# Patient Record
Sex: Female | Born: 1990 | Race: White | Hispanic: No | Marital: Single | Smoking: Never smoker
Health system: Southern US, Community
[De-identification: ages and names within clinical notes are randomized; demographics above are authoritative.]

## PROBLEM LIST (undated history)

## (undated) DIAGNOSIS — F419 Anxiety disorder, unspecified: Secondary | ICD-10-CM

---
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
## 2014-06-17 ENCOUNTER — Emergency Department: Payer: Self-pay | Admitting: Emergency Medicine

## 2021-03-16 ENCOUNTER — Other Ambulatory Visit: Payer: Self-pay

## 2021-03-16 ENCOUNTER — Ambulatory Visit
Admission: EM | Admit: 2021-03-16 | Discharge: 2021-03-16 | Disposition: A | Discharge status: Home / Self Care | Payer: Medicaid Other | Attending: Sports Medicine | Admitting: Sports Medicine

## 2021-03-16 ENCOUNTER — Ambulatory Visit: Admit: 2021-03-16 | Payer: Self-pay

## 2021-03-16 DIAGNOSIS — K121 Other forms of stomatitis: Secondary | ICD-10-CM | POA: Diagnosis not present

## 2021-03-16 DIAGNOSIS — J029 Acute pharyngitis, unspecified: Secondary | ICD-10-CM | POA: Diagnosis not present

## 2021-03-16 LAB — GROUP A STREP BY PCR: Group A Strep by PCR: NOT DETECTED

## 2021-03-16 MED ORDER — LIDOCAINE VISCOUS HCL 2 % MT SOLN: 15.0000 mL | Freq: Four times a day (QID) | OROMUCOSAL | 0 refills | Status: DC | PRN

## 2021-03-16 NOTE — ED Provider Notes (Signed)
[facility]    CSN: [id number] Arrival date & time: 03/16/21  1147      History   Chief Complaint No chief complaint on file.   HPI Nicole Curry is a 30 y.o. female.   30 year old female who presents for evaluation of an ulcerated blister on the posterior oropharynx on the right side.  She noticed it 2 days ago.  She does have an associated sore throat.  She says in the past she has had multiple strep infections.  No fever shakes chills.  No strep exposure.  No nausea vomiting or diarrhea.  No abdominal or urinary symptoms.  Primary care needs are met by [facility] in [place].  She stays at home and does not work outside of the home.  She reports that she sometimes gets she calls tonsil stones that she picks at it and she is concerned that maybe she infected that area.  Some mild pain with swallowing.  She has been doing salt water gargles but no throat lozenges.  She has no other URI symptoms including cough, nasal congestion, chest pain or shortness of breath.  No red flag signs or symptoms elicited on history.   History reviewed. No pertinent past medical history.  There are no problems to display for this patient.   History reviewed. No pertinent surgical history.  OB History   No obstetric history on file.      Home Medications    Prior to Admission medications   Medication Sig Start Date End Date Taking? Authorizing Provider  Levonorgestrel-Ethinyl Estradiol (AMETHIA) 0.15-0.03 &0.01 MG tablet Take 1 tablet by mouth daily. 05/10/20 05/10/21 Yes [provider]  lidocaine (XYLOCAINE) 2 % solution Use as directed 15 mLs in the mouth or throat every 6 (six) hours as needed for mouth pain. Swish and spit 03/16/21  Yes Verda Cumins, MD  DULoxetine (CYMBALTA) 30 MG capsule Take 30 mg by mouth every morning. 02/21/21   [provider]    Family History History reviewed. No pertinent family history.  Social History Social History   Tobacco Use    Smoking status: Never   Smokeless tobacco: Never     Allergies   Patient has no allergy information on record.   Review of Systems Review of Systems  Constitutional:  Negative for activity change, appetite change, chills, diaphoresis, fatigue and fever.  HENT:  Positive for sore throat and trouble swallowing. Negative for congestion, ear pain, postnasal drip, rhinorrhea, sinus pressure, sinus pain and sneezing.   Eyes:  Negative for pain.  Respiratory:  Negative for cough, chest tightness and shortness of breath.   Cardiovascular:  Negative for chest pain and palpitations.  Gastrointestinal:  Negative for abdominal pain, diarrhea, nausea and vomiting.  Genitourinary:  Negative for dysuria.  Musculoskeletal:  Negative for back pain, myalgias and neck pain.  Skin:  Negative for color change, pallor, rash and wound.  Neurological:  Negative for dizziness, speech difficulty, light-headedness and headaches.  All other systems reviewed and are negative.   Physical Exam Triage Vital Signs ED Triage Vitals  Enc Vitals Group     BP 03/16/21 1201 136/80     Pulse Rate 03/16/21 1201 85     Resp 03/16/21 1201 16     Temp 03/16/21 1201 98.4 F (36.9 C)     Temp Source 03/16/21 1201 Oral     SpO2 03/16/21 1201 100 %     Weight 03/16/21 1158 140 lb (63.5 kg)     Height  03/16/21 1158 _0  (1.6 m)     Head Circumference --      Peak Flow --      Pain Score 03/16/21 1158 3     Pain Loc --      Pain Edu? --      Excl. in [place]? --    No data found.  Updated Vital Signs BP 136/80 (BP Location: Left Arm)   Pulse 85   Temp 98.4 F (36.9 C) (Oral)   Resp 16   Ht _1  (1.6 m)   Wt 63.5 kg   LMP 01/14/2021 (Approximate)   SpO2 100%   BMI 24.80 kg/m   Visual Acuity Right Eye Distance:   Left Eye Distance:   Bilateral Distance:    Right Eye Near:   Left Eye Near:    Bilateral Near:     Physical Exam Vitals and nursing note reviewed.  Constitutional:      General: She is  not in acute distress.    Appearance: Normal appearance. She is not ill-appearing, toxic-appearing or diaphoretic.  HENT:     Head: Normocephalic and atraumatic.     Nose: Nose normal. No congestion or rhinorrhea.     Mouth/Throat:     Mouth: Mucous membranes are moist. Oral lesions present.     Pharynx: Uvula midline. Posterior oropharyngeal erythema present. No oropharyngeal exudate or uvula swelling.     Tonsils: No tonsillar exudate or tonsillar abscesses. 0 on the right. 0 on the left.      Comments: Round small whitish ulceration to the right of the midline in the posterior oropharynx.  No abscess formation.  No drainage. Eyes:     General: No scleral icterus.       Right eye: No discharge.        Left eye: No discharge.     Conjunctiva/sclera: Conjunctivae normal.     Pupils: Pupils are equal, round, and reactive to light.  Cardiovascular:     Rate and Rhythm: Normal rate and regular rhythm.     Pulses: Normal pulses.     Heart sounds: Normal heart sounds. No murmur heard.   No friction rub. No gallop.  Pulmonary:     Effort: Pulmonary effort is normal.     Breath sounds: Normal breath sounds. No stridor. No wheezing, rhonchi or rales.  Musculoskeletal:     Cervical back: Normal range of motion and neck supple.  Skin:    General: Skin is warm and dry.     Capillary Refill: Capillary refill takes less than 2 seconds.  Neurological:     General: No focal deficit present.     Mental Status: She is alert and oriented to person, place, and time.     UC Treatments / Results  Labs (all labs ordered are listed, but only abnormal results are displayed) Labs Reviewed  GROUP A STREP BY PCR    EKG   Radiology No results found.  Procedures Procedures (including critical care time)  Medications Ordered in UC Medications - No data to display  Initial Impression / Assessment and Plan / UC Course  I have reviewed the triage vital signs and the nursing notes.  Pertinent  labs & imaging results that were available during my care of the patient were reviewed by me and considered in my medical decision making (see chart for details).  Clinical impression: 1.  Oral ulceration in the posterior oropharynx 2.  Acute pharyngitis with pain with swallowing  Treatment plan:  1.  The findings and treatment plan were discussed in detail with the patient.  Patient was in agreement. 2.  Recommended getting a strep test.  It was negative. 3.  We will treat her symptomatically for the oral ulcer.  Did send in a prescription for viscous lidocaine. 4.  Educational handouts provided. 5.  Supportive care, over-the-counter meds as needed. 6.  I did discuss with her that rarely this can be an HSV infection.  If her symptoms do not improve then she should come back here or go to her primary care physician for swab and initiation of antiviral medication. 7.  She was discharged in stable condition will follow-up here as needed.    Final Clinical Impressions(s) / UC Diagnoses   Final diagnoses:  Oral ulcer  Acute pharyngitis, unspecified etiology     Discharge Instructions      As we discussed, your strep test was negative. You have an oral ulcer that I am going to go ahead and treat with medication.  Sent that to your pharmacy. Please see educational handouts. Just supportive care for now. Over-the-counter meds as needed. If symptoms persist please see your primary care provider.     ED Prescriptions     Medication Sig Dispense Auth. Provider   lidocaine (XYLOCAINE) 2 % solution Use as directed 15 mLs in the mouth or throat every 6 (six) hours as needed for mouth pain. Swish and spit 200 mL Verda Cumins, MD      PDMP not reviewed this encounter.   Verda Cumins, MD 03/16/21 1310

## 2021-03-16 NOTE — ED Triage Notes (Signed)
Pt states she started having a sore throat 2 days ago and noticed a white bump that is now like a blister. States she gets tonsil stones and was picking it out the other day and may have infected it.

## 2021-03-16 NOTE — Discharge Instructions (Addendum)
As we discussed, your strep test was negative. You have an oral ulcer that I am going to go ahead and treat with medication.  Sent that to your pharmacy. Please see educational handouts. Just supportive care for now. Over-the-counter meds as needed. If symptoms persist please see your primary care provider.

## 2021-09-11 ENCOUNTER — Other Ambulatory Visit: Payer: Self-pay

## 2021-09-11 ENCOUNTER — Ambulatory Visit (INDEPENDENT_AMBULATORY_CARE_PROVIDER_SITE_OTHER): Payer: Medicaid Other

## 2021-09-11 ENCOUNTER — Ambulatory Visit
Admission: RE | Admit: 2021-09-11 | Discharge: 2021-09-11 | Disposition: A | Discharge status: Home / Self Care | Payer: Medicaid Other | Source: Ambulatory Visit | Attending: Internal Medicine | Admitting: Internal Medicine

## 2021-09-11 ENCOUNTER — Ambulatory Visit: Admit: 2021-09-11 | Payer: Medicaid Other

## 2021-09-11 VITALS — BP 136/104 | HR 102 | Temp 98.6°F | Resp 16

## 2021-09-11 DIAGNOSIS — S161XXA Strain of muscle, fascia and tendon at neck level, initial encounter: Secondary | ICD-10-CM | POA: Diagnosis not present

## 2021-09-11 DIAGNOSIS — M542 Cervicalgia: Secondary | ICD-10-CM | POA: Diagnosis not present

## 2021-09-11 HISTORY — DX: Anxiety disorder, unspecified: F41.9

## 2021-09-11 NOTE — ED Triage Notes (Signed)
Patient presents to Urgent Care with complaints of neck injury that happened last night. She states her husband who weighs about 300 lbs who landed on her neck. She states increases with looking up or turning to the right. She reports hearing a "cracking" sound. Treating pain with ibuprofen, muscle relaxer, and icing.

## 2021-09-11 NOTE — ED Provider Notes (Signed)
[facility]    CSN: [id number] Arrival date & time: 09/11/21  1142      History   Chief Complaint Chief Complaint  Patient presents with   Neck Injury   Appointment    1200    HPI Nicole Curry is a 31 y.o. female.  She presents with pain in the angle of her right neck/shoulder, started after her husband (a heavy person) overbalanced while trying to catch an airborne object in the stands at a basketball game.  He was standing to the right of her, jumped up and landed on her, causing a left lateral neck hyperflexion.  She had some pain immediately in the right neck/shoulder, worse today.  She has preserved neck range of motion but it is painful to fully rotate neck to the right and to hyperextend her neck.  Most pain is concentrated in the angle of her right lateral neck/shoulder.    No other injuries reported.  No unusual weakness/clumsiness of arms/legs, no change in bowel/bladder continence.    She is leaving on a cruise in several days and wanted to get checked out in advance of being out at sea.  Using ice, ibuprofen, muscle relaxer for symptom relief.    HPI  Past Medical History:  Diagnosis Date   Anxiety     There are no problems to display for this patient.   History reviewed. No pertinent surgical history.  Home Medications    Prior to Admission medications   Medication Sig Start Date End Date Taking? Authorizing Provider  Multiple Vitamins-Minerals (FOLITIN-Z) TABS Take 1 tablet by mouth daily. 02/21/21  Yes [provider]  cyclobenzaprine (FLEXERIL) 5 MG tablet Take by mouth. 07/12/21   [provider]  DULoxetine (CYMBALTA) 30 MG capsule Take 30 mg by mouth every morning. 02/21/21   [provider]  Levonorgestrel-Ethinyl Estradiol (AMETHIA) 0.15-0.03 &0.01 MG tablet Take 1 tablet by mouth daily. 05/10/20 05/10/21  [provider]    Family History History reviewed. No pertinent family history.  Social  History Social History   Tobacco Use   Smoking status: Never   Smokeless tobacco: Never  Vaping Use   Vaping Use: Never used  Substance Use Topics   Alcohol use: Yes    Comment: social drinker   Drug use: Never     Allergies   Patient has no known allergies.   Review of Systems Review of Systems  See HPI   Physical Exam Triage Vital Signs ED Triage Vitals  Enc Vitals Group     BP 09/11/21 1223 (!) 136/104     Pulse Rate 09/11/21 1223 (!) 102     Resp 09/11/21 1223 16     Temp 09/11/21 1223 98.6 F (37 C)     Temp Source 09/11/21 1223 Oral     SpO2 09/11/21 1223 100 %     Weight --      Height --      Pain Score 09/11/21 1219 2     Pain Loc --     Updated Vital Signs BP (!) 136/104 (BP Location: Left Arm)    Pulse (!) 102    Temp 98.6 F (37 C) (Oral)    Resp 16    LMP  (Within Months) Comment: approximately 3 months ago   SpO2 100%   Physical Exam HENT:     Head: Atraumatic.     Mouth/Throat:     Mouth: Mucous membranes are moist.  Eyes:  Conjunctiva/sclera:     Right eye: Right conjunctiva is not injected. No exudate.    Left eye: Left conjunctiva is not injected. No exudate.    Comments: Conjugate gaze observed  Cardiovascular:     Rate and Rhythm: Normal rate.  Pulmonary:     Effort: Pulmonary effort is normal. No respiratory distress.  Abdominal:     General: There is no distension.  Musculoskeletal:       Arms:     Comments: Pain is worst as marked, hurts most with R rotation and extension--preserved neck ROM but painful to fully rotate to the right and hyperextend neck.   No focal bony tenderness of C spine to percussion/palpation Able to fully extend both arms overhead at shoulders, int/ext rotate shoulders. Walked into urgent care independently, able to climb on/off exam table easily, without assist.    Skin:    General: Skin is warm and dry.     Comments: Pink, no cyanosis  Neurological:     Comments: Face symmetric, speech  clear/coherent/logical     UC Treatments / Results  Labs (all labs ordered are listed, but only abnormal results are displayed) Labs Reviewed - No data to display NA  EKG NA  Radiology DG Cervical Spine Complete  Result Date: 09/11/2021 CLINICAL DATA:  pain in right neck/shoulder after left lateral neck hyperflexion event, caused by a heavy person landing on her at a ball game last night EXAM: CERVICAL SPINE - COMPLETE 4+ VIEW COMPARISON:  None. FINDINGS: There is no radiographically evident cervical spine fracture. Patent neural foramina. Straightening of the cervical lordosis likely due to patient positioning. Otherwise normal alignment. IMPRESSION: Negative cervical spine radiographs. Electronically Signed   By: Caprice Renshaw M.D.   On: 09/11/2021 13:33     Procedures Procedures (including critical care time) NA  Medications Ordered in UC Medications - No data to display NA  Final Clinical Impressions(s) / UC Diagnoses   Final diagnoses:  Neck muscle strain, initial encounter     Discharge Instructions      Symptoms, exam, and xrays today suggest that you have muscle and soft tissues pain in the angle of your right neck/shoulder.  Neck xrays today were negative for bony injury/fracture.  Use ice for 10-15 minutes several times daily to help with pain, and take advil or aleve as needed to help with pain.  Avoid severely painful activities.  Anticipate gradual improvement in discomfort over the next several days; may take a few weeks to feel back to normal.  Followup if needed with a sports medicine provider or orthopedist if not improving as expected.     ED Prescriptions   None    PDMP not reviewed this encounter.   Isa Rankin, MD 09/12/21 [phone]

## 2021-09-11 NOTE — Discharge Instructions (Addendum)
Symptoms, exam, and xrays today suggest that you have muscle and soft tissues pain in the angle of your right neck/shoulder.  Neck xrays today were negative for bony injury/fracture.  Use ice for 10-15 minutes several times daily to help with pain, and take advil or aleve as needed to help with pain.  Avoid severely painful activities.  Anticipate gradual improvement in discomfort over the next several days; may take a few weeks to feel back to normal.  Followup if needed with a sports medicine provider or orthopedist if not improving as expected.

## 2023-08-18 IMAGING — CR DG CERVICAL SPINE COMPLETE 4+V
6 series · 6 of 6 positions shown · non-contrast
Comparison: None.

CLINICAL DATA: pain in right neck/shoulder after left lateral neck
hyperflexion event, caused by a heavy person landing on her at a
ball game last night

EXAM:
CERVICAL SPINE - COMPLETE 4+ VIEW

[c-spine lat]
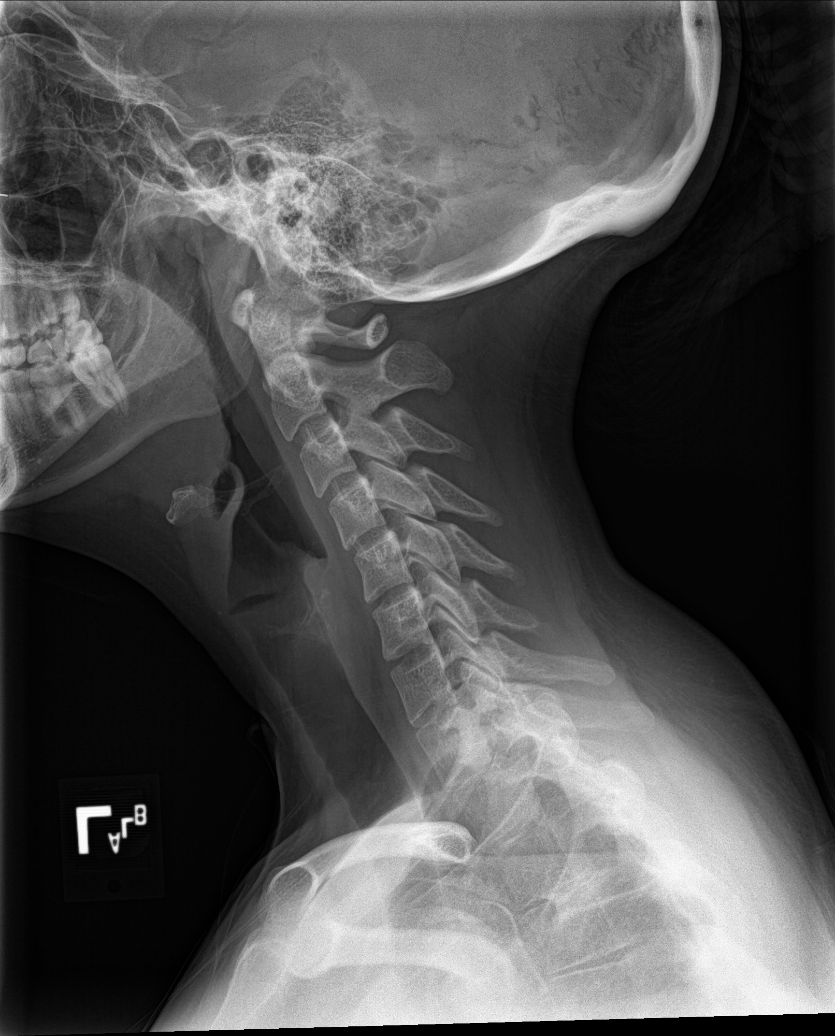

[c-spine obl (1 of 2)]
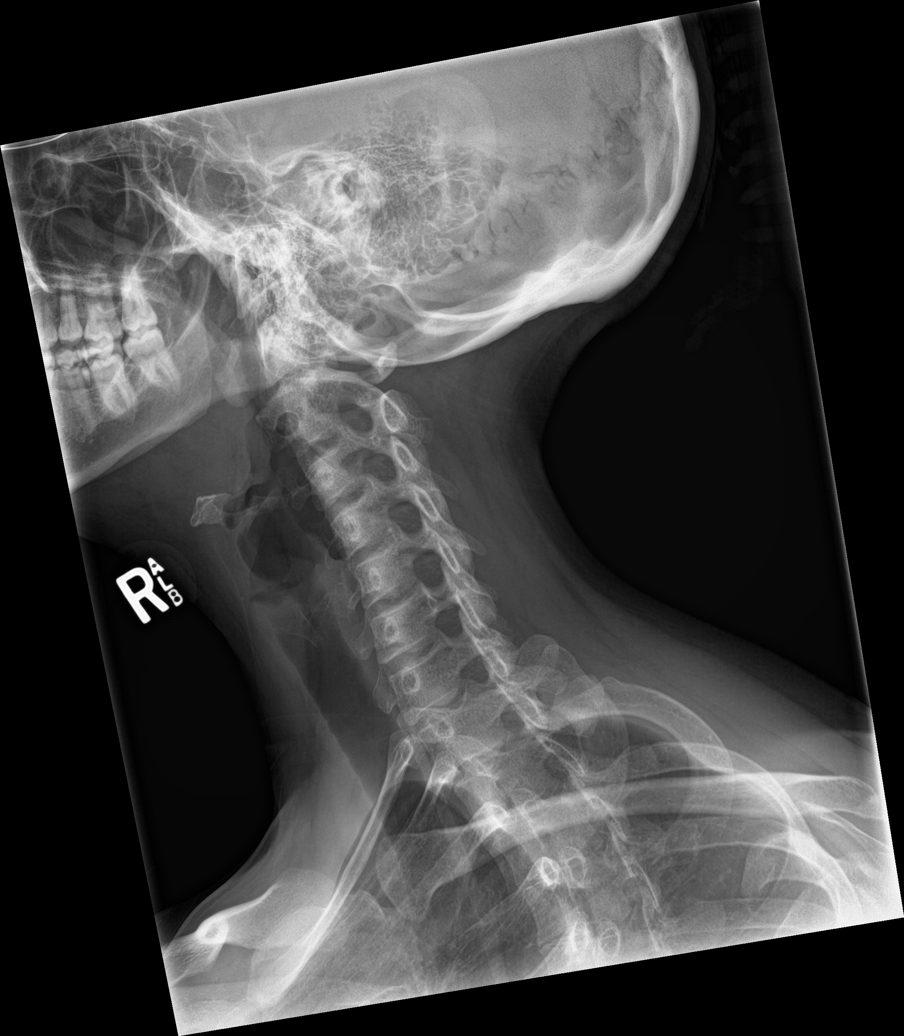

[c-spine obl (2 of 2)]
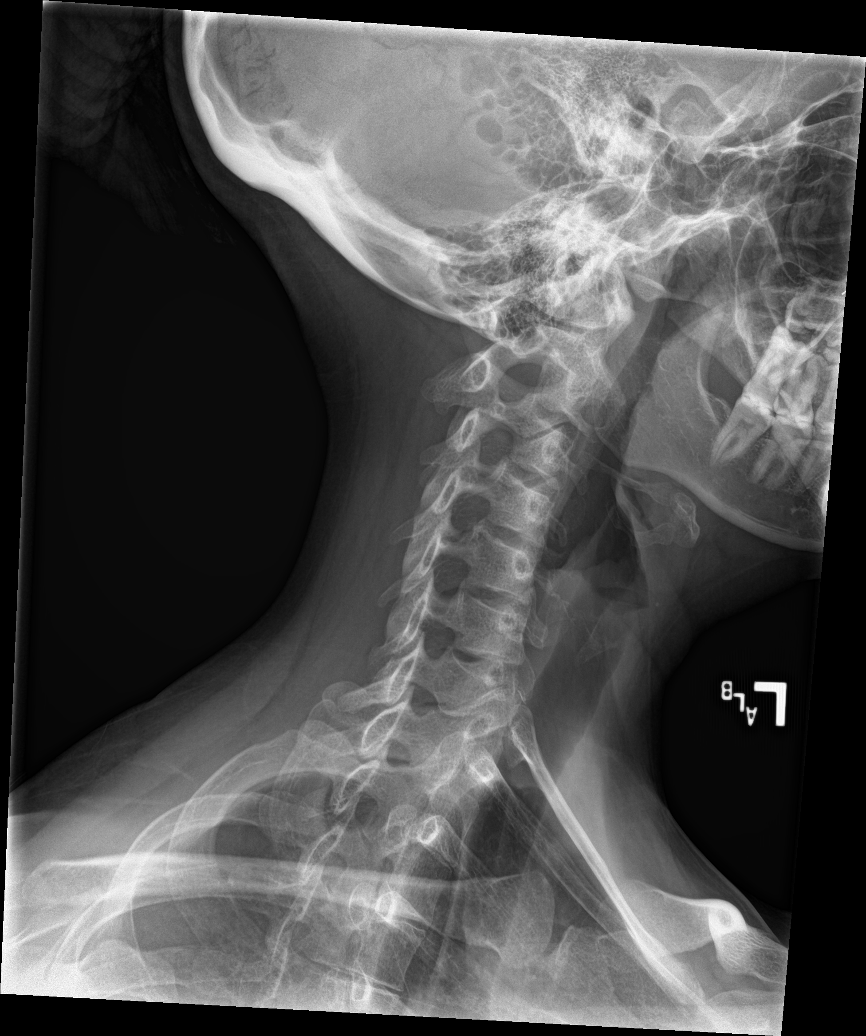

[c-spine ap]
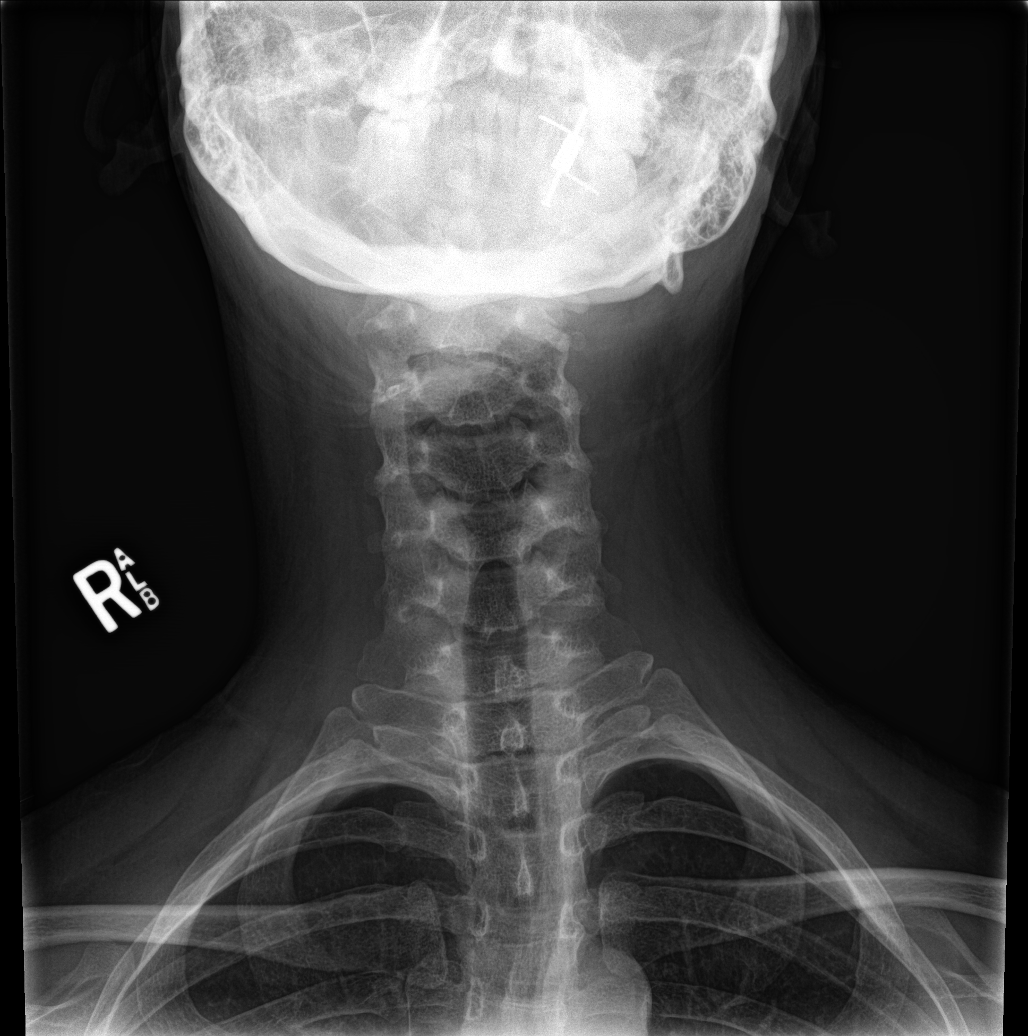

[c-spine open mouth]
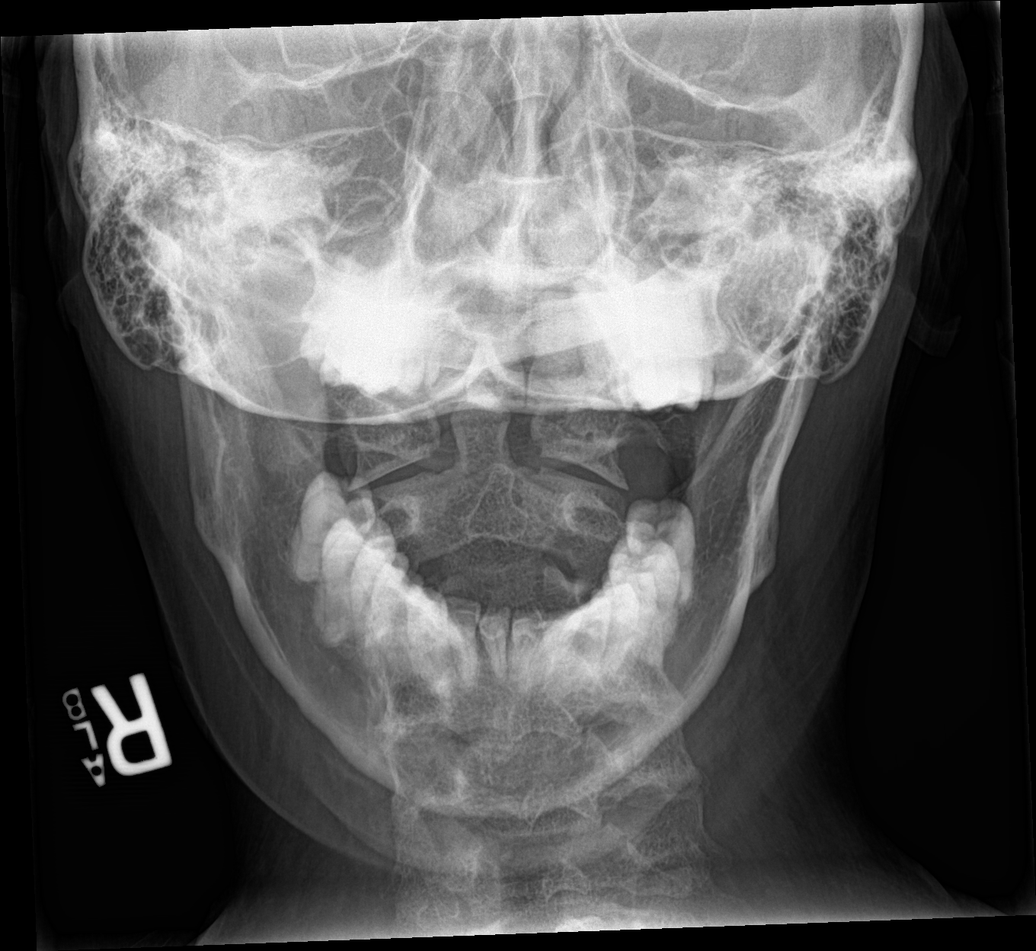

[[person_name]]
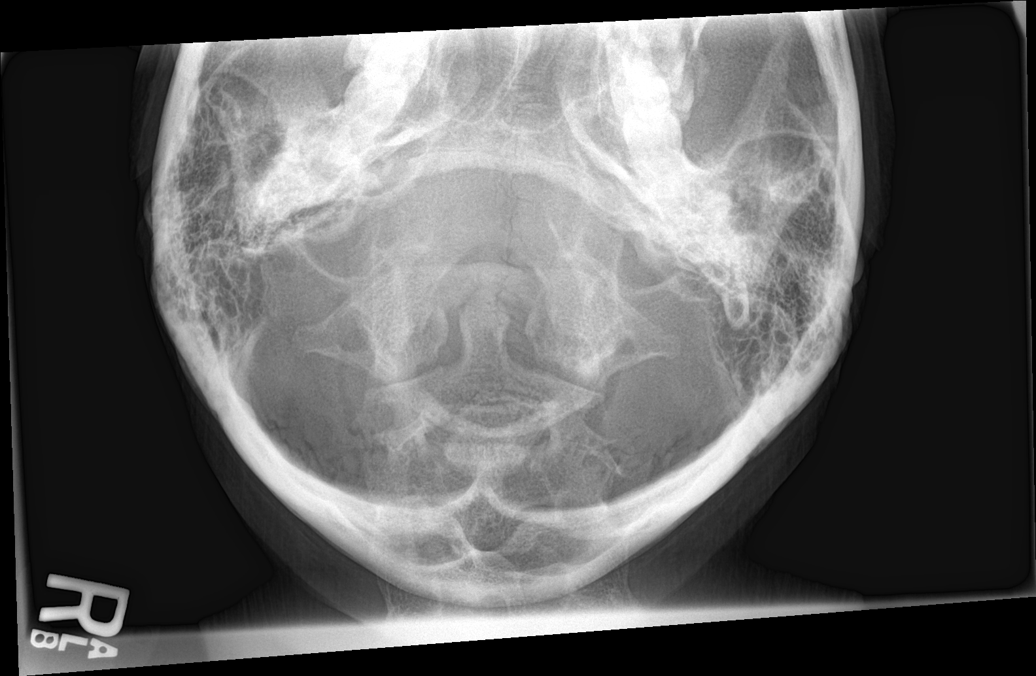

[6 of 6 positions shown; findings below may reference images not displayed]

FINDINGS: There is no radiographically evident cervical spine fracture. Patent
neural foramina. Straightening of the cervical lordosis likely due
to patient positioning. Otherwise normal alignment.
IMPRESSION: Negative cervical spine radiographs.
